# Patient Record
Sex: Male | Born: 1995 | Race: White | Hispanic: No | Marital: Single | Smoking: Never smoker
Health system: Southern US, Community
[De-identification: ages and names within clinical notes are randomized; demographics above are authoritative.]

## PROBLEM LIST (undated history)

## (undated) HISTORY — PX: TONSILLECTOMY AND ADENOIDECTOMY: SUR1326

## (undated) HISTORY — PX: TYMPANOPLASTY: SHX33

---
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
## 2013-04-21 IMAGING — CR DG CHEST 2V
1 series · 2 of 2 positions shown · non-contrast
Comparison: none

REASON FOR EXAM: shortness of breath
COMMENTS:

PROCEDURE:     DXR - DXR CHEST PA (OR AP) AND LATERAL  - December 26, 2011  [DATE]
RESULT:     The lungs are clear. The cardiac silhouette and visualized bony
skeleton are unremarkable.

[Series 1: w chest pa · 0.14mm/px · 2 of 2 slices shown]
[im 1/2]
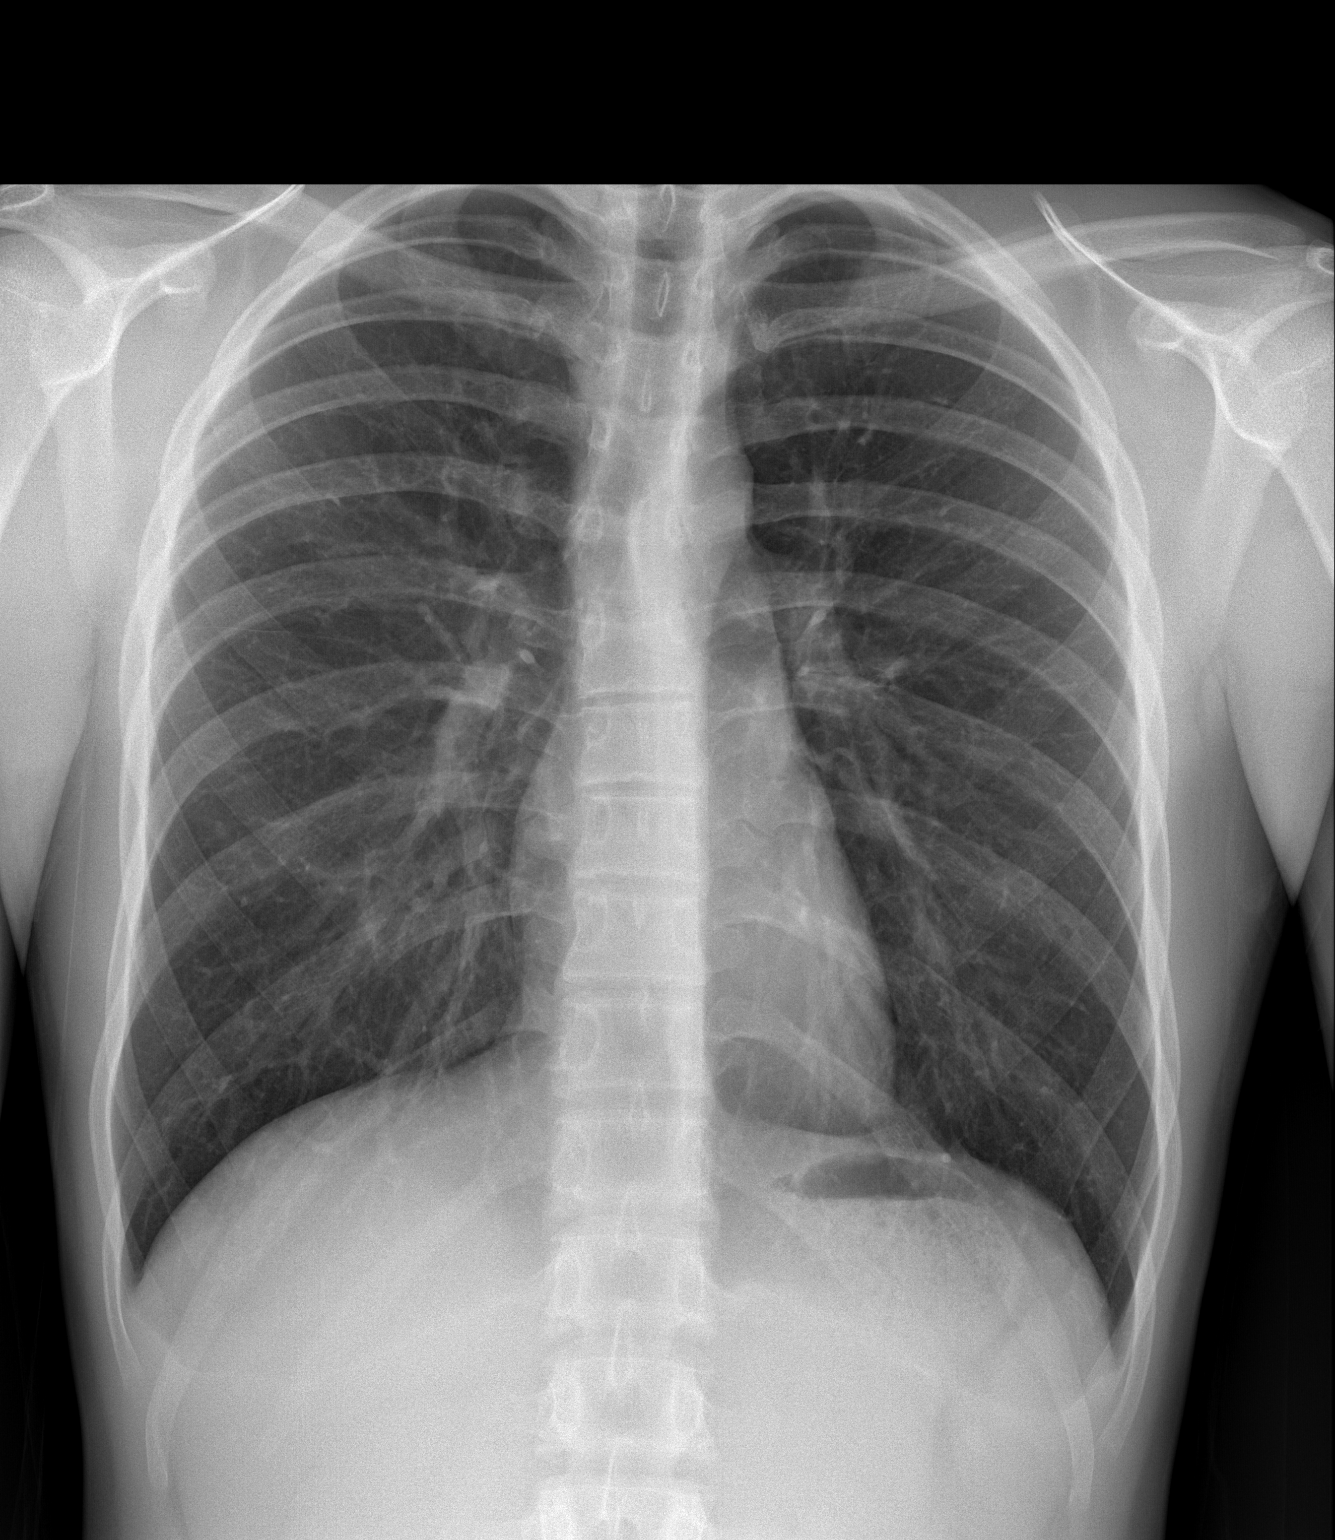
[im 2/2]
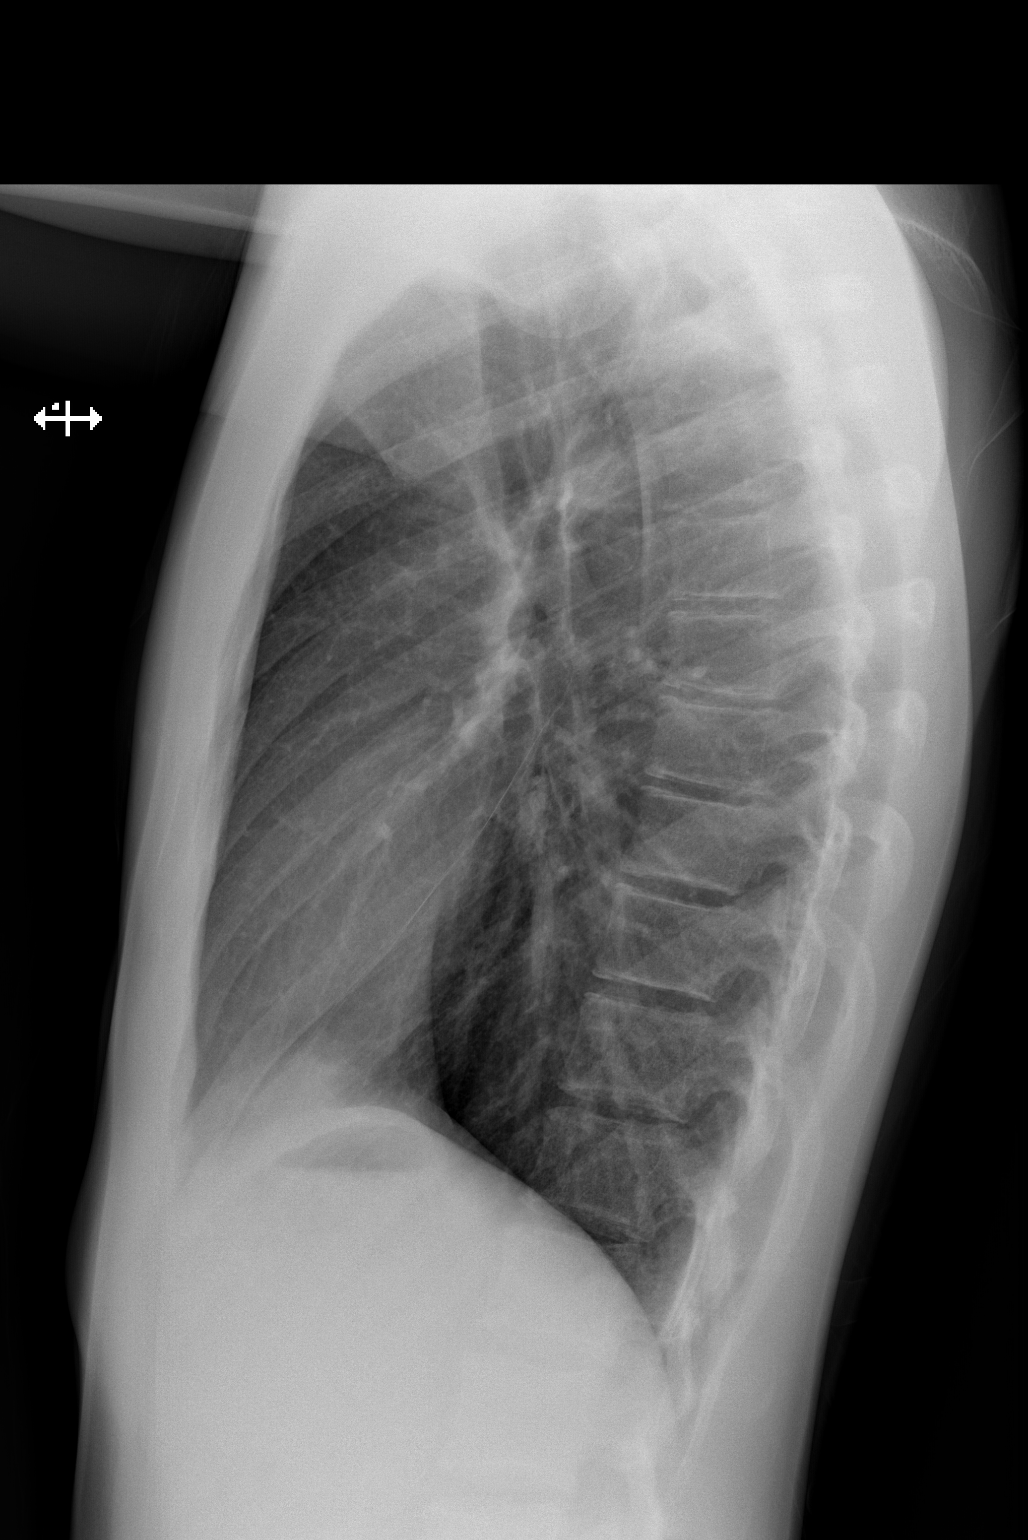

[2 of 2 positions shown; findings below may reference images not displayed]

IMPRESSION: 1. Chest radiograph without evidence of acute cardiopulmonary disease.

## 2015-08-11 DIAGNOSIS — H903 Sensorineural hearing loss, bilateral: Secondary | ICD-10-CM | POA: Diagnosis not present

## 2015-08-11 DIAGNOSIS — H93293 Other abnormal auditory perceptions, bilateral: Secondary | ICD-10-CM | POA: Diagnosis not present

## 2015-08-11 DIAGNOSIS — H9319 Tinnitus, unspecified ear: Secondary | ICD-10-CM | POA: Diagnosis not present

## 2016-04-04 DIAGNOSIS — L237 Allergic contact dermatitis due to plants, except food: Secondary | ICD-10-CM | POA: Diagnosis not present

## 2016-07-20 ENCOUNTER — Telehealth: Payer: Self-pay | Admitting: Nurse Practitioner

## 2016-07-20 DIAGNOSIS — J111 Influenza due to unidentified influenza virus with other respiratory manifestations: Secondary | ICD-10-CM

## 2016-07-20 MED ORDER — OSELTAMIVIR PHOSPHATE 75 MG PO CAPS: 75.0000 mg | ORAL_CAPSULE | Freq: Two times a day (BID) | ORAL | 0 refills | Status: AC

## 2016-07-20 NOTE — Progress Notes (Signed)

## 2017-08-28 DIAGNOSIS — L700 Acne vulgaris: Secondary | ICD-10-CM | POA: Diagnosis not present

## 2019-02-27 ENCOUNTER — Other Ambulatory Visit: Payer: Self-pay | Admitting: *Deleted

## 2019-02-27 DIAGNOSIS — Z20822 Contact with and (suspected) exposure to covid-19: Secondary | ICD-10-CM

## 2019-02-28 LAB — NOVEL CORONAVIRUS, NAA: SARS-CoV-2, NAA: NOT DETECTED

## 2019-04-15 ENCOUNTER — Other Ambulatory Visit: Payer: Self-pay | Admitting: *Deleted

## 2019-04-15 DIAGNOSIS — Z20822 Contact with and (suspected) exposure to covid-19: Secondary | ICD-10-CM

## 2019-04-17 LAB — NOVEL CORONAVIRUS, NAA: SARS-CoV-2, NAA: NOT DETECTED

## 2019-06-26 ENCOUNTER — Ambulatory Visit: Payer: BC Managed Care – PPO | Attending: Internal Medicine

## 2019-06-26 DIAGNOSIS — Z20822 Contact with and (suspected) exposure to covid-19: Secondary | ICD-10-CM

## 2019-06-27 LAB — NOVEL CORONAVIRUS, NAA: SARS-CoV-2, NAA: NOT DETECTED

## 2023-01-31 ENCOUNTER — Other Ambulatory Visit: Payer: Self-pay

## 2023-01-31 ENCOUNTER — Encounter: Payer: Self-pay | Admitting: Emergency Medicine

## 2023-01-31 ENCOUNTER — Emergency Department
Admission: EM | Admit: 2023-01-31 | Discharge: 2023-01-31 | Disposition: A | Discharge status: Home / Self Care | Payer: BC Managed Care – PPO | Attending: Emergency Medicine | Admitting: Emergency Medicine

## 2023-01-31 ENCOUNTER — Emergency Department: Payer: BC Managed Care – PPO

## 2023-01-31 DIAGNOSIS — R519 Headache, unspecified: Secondary | ICD-10-CM | POA: Insufficient documentation

## 2023-01-31 LAB — BASIC METABOLIC PANEL
Anion gap: 7 (ref 5–15)
BUN: 15 mg/dL (ref 6–20)
CO2: 27 mmol/L (ref 22–32)
Calcium: 8.8 mg/dL — ABNORMAL LOW (ref 8.9–10.3)
Chloride: 102 mmol/L (ref 98–111)
Creatinine, Ser: 0.8 mg/dL (ref 0.61–1.24)
GFR, Estimated: >60 mL/min (ref >60)
Glucose, Bld: 84 mg/dL (ref 70–99)
Potassium: 3.4 mmol/L — ABNORMAL LOW (ref 3.5–5.1)
Sodium: 136 mmol/L (ref 135–145)

## 2023-01-31 LAB — CBC
HCT: 44.5 % (ref 39.0–52.0)
Hemoglobin: 14.3 g/dL (ref 13.0–17.0)
MCH: 27.4 pg (ref 26.0–34.0)
MCHC: 32.1 g/dL (ref 30.0–36.0)
MCV: 85.2 fL (ref 80.0–100.0)
Platelets: 236 10*3/uL (ref 150–400)
RBC: 5.22 MIL/uL (ref 4.22–5.81)
RDW: 11.9 % (ref 11.5–15.5)
WBC: 5.6 10*3/uL (ref 4.0–10.5)
nRBC: 0 % (ref 0.0–0.2)

## 2023-01-31 NOTE — ED Provider Notes (Signed)
[facility] Provider Note    Event Date/Time   First MD Initiated Contact with Patient 01/31/23 1633     (approximate)   History   Chief Complaint: Headache   HPI  Evan Ellis is a 27 y.o. male with no past medical history who comes ED complaining of intermittent headaches over the past 3 weeks.  He reports that he gets headaches frequently over time, but over the last 3 weeks they seem a little bit worse.  No specific visual disturbance or prodromal symptoms.  On detailed history there is no blurred vision or double vision, no photophobia or phonophobia.  Denies a history of migraines.  No recent trauma, no fever or chills or neck stiffness.  No paresthesias or motor weakness.  No aggravating or alleviating factors.  Not position or temporally related.  Eating and drinking okay.  Describes the headache as mild and current pain score is 1.  Patient has not taken any medication at home for pain.     Physical Exam   Triage Vital Signs: ED Triage Vitals  Encounter Vitals Group     BP 01/31/23 1409 113/75     Systolic BP Percentile --      Diastolic BP Percentile --      Pulse Rate 01/31/23 1409 84     Resp 01/31/23 1409 18     Temp 01/31/23 1409 98.7 F (37.1 C)     Temp Source 01/31/23 1409 Oral     SpO2 01/31/23 1409 99 %     Weight 01/31/23 1407 129 lb (58.5 kg)     Height 01/31/23 1407 5\' 7"  (1.702 m)     Head Circumference --      Peak Flow --      Pain Score 01/31/23 1407 2     Pain Loc --      Pain Education --      Exclude from Growth Chart --     Most recent vital signs: Vitals:   01/31/23 1409  BP: 113/75  Pulse: 84  Resp: 18  Temp: 98.7 F (37.1 C)  SpO2: 99%    General: Awake, no distress.  CV:  Good peripheral perfusion.  Resp:  Normal effort.  Abd:  No distention.  Other:  Cranial nerves II through XII intact.  No nystagmus.  No APD.  Funduscopy normal with sharp optic disks.  Normal motor function and cerebellar  function  ED Results / Procedures / Treatments   Labs (all labs ordered are listed, but only abnormal results are displayed) Labs Reviewed  BASIC METABOLIC PANEL - Abnormal; Notable for the following components:      Result Value   Potassium 3.4 (*)    Calcium 8.8 (*)    All other components within normal limits  CBC     EKG    RADIOLOGY CT head interpreted by me, appears normal.  Radiology report reviewed   PROCEDURES:  Procedures   MEDICATIONS ORDERED IN ED: Medications - No data to display   IMPRESSION / MDM / ASSESSMENT AND PLAN / ED COURSE  I reviewed the triage vital signs and the nursing notes.  DDx: Benign headache syndrome.,  Electrolyte abnormality, anemia, intracranial mass  Patient's presentation is most consistent with acute presentation with potential threat to life or bodily function.  Patient presents with intermittent mild headaches.  Symptomatology is reassuring.  Exam is normal and reassuring.  Engaged in shared decision-making with the patient, and would prefer to proceed  with neuroimaging today since his headaches do seem worse than he is used to over the past few weeks, although still describes them as mild.  No thunderclap symptoms or neurodeficits.  Will obtain CT.  Patient declines NSAIDs or nausea medicine at this time.       FINAL CLINICAL IMPRESSION(S) / ED DIAGNOSES   Final diagnoses:  Nonintractable episodic headache, unspecified headache type     Rx / DC Orders   ED Discharge Orders     None        Note:  This document was prepared using Dragon voice recognition software and may include unintentional dictation errors.   Sharman Cheek, MD 01/31/23 [phone]

## 2023-01-31 NOTE — Discharge Instructions (Addendum)
Your CT scan of the brain is normal.  You can take tylenol 650mg  and/or ibuprofen 600mg  every 6 hours as needed for headaches.

## 2023-01-31 NOTE — ED Triage Notes (Signed)
Pt via POV from home. Pt c/o anterior headache starting 1 month ago intermittently. Pt also stated he has been having light sensitivity and blurred vision since Sunday. Denies any hx of migraine. Denies NV. Denies any head injury. Pt is A&OX4 and NAD.

## 2023-01-31 NOTE — ED Notes (Signed)
First Nurse Note: Patient to ED from [facility] for mild headache, asphasia, right leg weakness and blurred vision that started 12 week ago.

## 2023-02-02 ENCOUNTER — Other Ambulatory Visit: Payer: Self-pay

## 2023-02-02 MED ORDER — ESCITALOPRAM OXALATE 5 MG PO TABS
5.0000 mg | ORAL_TABLET | Freq: Every day | ORAL | 1 refills | Status: AC
  Filled 2023-02-02 (×2): qty 90, 90d supply, fill #0

## 2023-02-23 ENCOUNTER — Emergency Department: Payer: BC Managed Care – PPO

## 2023-02-23 ENCOUNTER — Emergency Department
Admission: EM | Admit: 2023-02-23 | Discharge: 2023-02-23 | Disposition: A | Discharge status: Home / Self Care | Payer: BC Managed Care – PPO | Attending: Emergency Medicine | Admitting: Emergency Medicine

## 2023-02-23 ENCOUNTER — Other Ambulatory Visit: Payer: Self-pay

## 2023-02-23 ENCOUNTER — Encounter: Payer: Self-pay | Admitting: Emergency Medicine

## 2023-02-23 DIAGNOSIS — R1011 Right upper quadrant pain: Secondary | ICD-10-CM | POA: Insufficient documentation

## 2023-02-23 DIAGNOSIS — R109 Unspecified abdominal pain: Secondary | ICD-10-CM

## 2023-02-23 LAB — COMPREHENSIVE METABOLIC PANEL
ALT: 16 U/L (ref 0–44)
AST: 19 U/L (ref 15–41)
Albumin: 4.4 g/dL (ref 3.5–5.0)
Alkaline Phosphatase: 35 U/L — ABNORMAL LOW (ref 38–126)
Anion gap: 10 (ref 5–15)
BUN: 19 mg/dL (ref 6–20)
CO2: 27 mmol/L (ref 22–32)
Calcium: 8.9 mg/dL (ref 8.9–10.3)
Chloride: 102 mmol/L (ref 98–111)
Creatinine, Ser: 0.78 mg/dL (ref 0.61–1.24)
GFR, Estimated: >60 mL/min (ref >60)
Glucose, Bld: 97 mg/dL (ref 70–99)
Potassium: 3.5 mmol/L (ref 3.5–5.1)
Sodium: 139 mmol/L (ref 135–145)
Total Bilirubin: 0.8 mg/dL (ref 0.3–1.2)
Total Protein: 7.3 g/dL (ref 6.5–8.1)

## 2023-02-23 LAB — CBC WITH DIFFERENTIAL/PLATELET
Abs Immature Granulocytes: 0.02 10*3/uL (ref 0.00–0.07)
Basophils Absolute: 0.1 10*3/uL (ref 0.0–0.1)
Basophils Relative: 1 %
Eosinophils Absolute: 0.3 10*3/uL (ref 0.0–0.5)
Eosinophils Relative: 4 %
HCT: 41.3 % (ref 39.0–52.0)
Hemoglobin: 13.5 g/dL (ref 13.0–17.0)
Immature Granulocytes: 0 %
Lymphocytes Relative: 38 %
Lymphs Abs: 3 10*3/uL (ref 0.7–4.0)
MCH: 27.8 pg (ref 26.0–34.0)
MCHC: 32.7 g/dL (ref 30.0–36.0)
MCV: 85 fL (ref 80.0–100.0)
Monocytes Absolute: 0.9 10*3/uL (ref 0.1–1.0)
Monocytes Relative: 11 %
Neutro Abs: 3.8 10*3/uL (ref 1.7–7.7)
Neutrophils Relative %: 46 %
Platelets: 207 10*3/uL (ref 150–400)
RBC: 4.86 MIL/uL (ref 4.22–5.81)
RDW: 11.9 % (ref 11.5–15.5)
WBC: 8 10*3/uL (ref 4.0–10.5)
nRBC: 0 % (ref 0.0–0.2)

## 2023-02-23 LAB — URINALYSIS, ROUTINE W REFLEX MICROSCOPIC
Bacteria, UA: NONE SEEN
Bilirubin Urine: NEGATIVE
Glucose, UA: NEGATIVE mg/dL
Ketones, ur: NEGATIVE mg/dL
Leukocytes,Ua: NEGATIVE
Nitrite: NEGATIVE
Protein, ur: NEGATIVE mg/dL
Specific Gravity, Urine: 1.013 (ref 1.005–1.030)
Squamous Epithelial / HPF: 0 /HPF (ref 0–5)
pH: 6 (ref 5.0–8.0)

## 2023-02-23 LAB — LIPASE, BLOOD: Lipase: 29 U/L (ref 11–51)

## 2023-02-23 MED ORDER — KETOROLAC TROMETHAMINE 30 MG/ML IJ SOLN
15.0000 mg | Freq: Once | INTRAMUSCULAR | Status: AC
  Administered 2023-02-23: 15 mg via INTRAVENOUS
  Filled 2023-02-23: qty 1

## 2023-02-23 MED ORDER — SODIUM CHLORIDE 0.9 % IV BOLUS
1000.0000 mL | Freq: Once | INTRAVENOUS | Status: AC
  Administered 2023-02-23: 1000 mL via INTRAVENOUS

## 2023-02-23 MED ORDER — KETOROLAC TROMETHAMINE 10 MG PO TABS
10.0000 mg | ORAL_TABLET | Freq: Four times a day (QID) | ORAL | 0 refills | Status: AC | PRN
  Filled 2023-02-23: qty 20, 5d supply, fill #0

## 2023-02-23 MED ORDER — ONDANSETRON HCL 4 MG/2ML IJ SOLN
4.0000 mg | Freq: Once | INTRAMUSCULAR | Status: AC
  Administered 2023-02-23: 4 mg via INTRAVENOUS
  Filled 2023-02-23: qty 2

## 2023-02-23 NOTE — Discharge Instructions (Addendum)
Please take your medication as needed for discomfort.  Return to the emergency department for any significant discomfort, fever, vomiting or any other symptom personally concerning to yourself.

## 2023-02-23 NOTE — ED Provider Notes (Signed)
-----------------------------------------   9:34 AM on 02/23/2023 ----------------------------------------- Patient's results have shown a small amount of blood in the patient's urine dip, reassuring CBC reassuring chemistry.  Patient CT scan shows no acute findings.  Given the small amount of hemoglobin on the urine dip with flank pain consistent with a kidney stone improved after Toradol highly suspicious of ureterolithiasis either punctate stone caught between CT slices versus passed stone.  Discussed with the patient discharged on a trial of Toradol to be used as needed and follow-up with his doctor.  I have sent a urine culture as a precaution.   Minna Antis, MD 02/23/23 [phone]

## 2023-02-23 NOTE — ED Triage Notes (Signed)
Patient ambulatory to triage with steady gait, without difficulty or distress noted; pt reports back pain that radiates into upper abd since yesterday with no accomp symptoms; denies hx of same

## 2023-02-23 NOTE — ED Provider Notes (Signed)
[facility] Provider Note    Event Date/Time   First MD Initiated Contact with Patient 02/23/23 [phone]     (approximate)   History   Abdominal Pain   HPI  Evan Ellis is a 27 y.o. male who presents to the ED from home with a chief complaint of nontraumatic right flank pain radiating to right upper abdomen which awakened him from sleep approximately 24 hours ago.  Denies associated fever/chills, chest pain, shortness of breath, nausea, vomiting or hematuria.  Denies testicular pain or swelling.     Past Medical History  History reviewed. No pertinent past medical history.   Active Problem List  There are no problems to display for this patient.    Past Surgical History   Past Surgical History:  Procedure Laterality Date   TONSILLECTOMY AND ADENOIDECTOMY     TYMPANOPLASTY       Home Medications   Prior to Admission medications   Medication Sig Start Date End Date Taking? Authorizing Provider  escitalopram (LEXAPRO) 5 MG tablet Take 1 tablet (5 mg total) by mouth daily. 02/02/23     oseltamivir (TAMIFLU) 75 MG capsule Take 1 capsule (75 mg total) by mouth 2 (two) times daily. 07/20/16   Bennie Pierini, FNP     Allergies  Augmentin [amoxicillin-pot clavulanate] and Omnicef [cefdinir]   Family History  History reviewed. No pertinent family history.   Physical Exam  Triage Vital Signs: ED Triage Vitals  Encounter Vitals Group     BP 02/23/23 0602 108/75     Systolic BP Percentile --      Diastolic BP Percentile --      Pulse Rate 02/23/23 0602 72     Resp 02/23/23 0602 16     Temp 02/23/23 0602 97.6 F (36.4 C)     Temp Source 02/23/23 0602 Oral     SpO2 02/23/23 0602 99 %     Weight 02/23/23 0556 130 lb (59 kg)     Height 02/23/23 0556 5\' 8"  (1.727 m)     Head Circumference --      Peak Flow --      Pain Score 02/23/23 0556 9     Pain Loc --      Pain Education --      Exclude from Growth Chart --     Updated  Vital Signs: BP 108/75 (BP Location: Right Arm)   Pulse 72   Temp 97.6 F (36.4 C) (Oral)   Resp 16   Ht 5\' 8"  (1.727 m)   Wt 59 kg   SpO2 99%   BMI 19.77 kg/m    General: Awake, no distress.  CV:  RRR.  Good peripheral perfusion. Resp:  Normal effort.  CTAB. Abd:  Mild right upper quadrant tenderness to palpation without rebound or guarding.  Mild right CVAT.  No distention.  Other:  No truncal vesicles.   ED Results / Procedures / Treatments  Labs (all labs ordered are listed, but only abnormal results are displayed) Labs Reviewed  COMPREHENSIVE METABOLIC PANEL - Abnormal; Notable for the following components:      Result Value   Alkaline Phosphatase 35 (*)    All other components within normal limits  CBC WITH DIFFERENTIAL/PLATELET  LIPASE, BLOOD  URINALYSIS, ROUTINE W REFLEX MICROSCOPIC     EKG  None   RADIOLOGY CT pending   Official radiology report(s): No results found.   PROCEDURES:  Critical Care performed: No  Procedures   MEDICATIONS  ORDERED IN ED: Medications  sodium chloride 0.9 % bolus 1,000 mL (1,000 mLs Intravenous New Bag/Given 02/23/23 0642)  ondansetron (ZOFRAN) injection 4 mg (4 mg Intravenous Given 02/23/23 0642)  ketorolac (TORADOL) 30 MG/ML injection 15 mg (15 mg Intravenous Given 02/23/23 6301)     IMPRESSION / MDM / ASSESSMENT AND PLAN / ED COURSE  I reviewed the triage vital signs and the nursing notes.                             27 year old male presenting with right flank/upper abdominal pain. Differential diagnosis includes, but is not limited to, biliary disease (biliary colic, acute cholecystitis, cholangitis, choledocholithiasis, etc), intrathoracic causes for epigastric abdominal pain including ACS, gastritis, duodenitis, pancreatitis, small bowel or large bowel obstruction, renal colic, abdominal aortic aneurysm, hernia, and ulcer(s).  I have personally reviewed patient's records and note a PCP office visit on 02/02/2023  for anxiety.  Patient's presentation is most consistent with acute presentation with potential threat to life or bodily function.  The patient is on the cardiac monitor to evaluate for evidence of arrhythmia and/or significant heart rate changes.  Will obtain basic lab work, UA, CT renal colic study.  Initiate IV fluid hydration, IV Toradol for pain.  Will reassess.  Clinical Course as of 02/23/23 6010  Thu Feb 23, 2023  0702 Laboratory results unremarkable.  Care transferred to Dr. Lenard Lance change of shift pending UA and CT scan. [JS]    Clinical Course User Index [JS] Irean Hong, MD     FINAL CLINICAL IMPRESSION(S) / ED DIAGNOSES   Final diagnoses:  Right upper quadrant abdominal pain  Right flank pain     Rx / DC Orders   ED Discharge Orders     None        Note:  This document was prepared using Dragon voice recognition software and may include unintentional dictation errors.   Irean Hong, MD 02/23/23 [phone]

## 2023-02-24 LAB — URINE CULTURE: Culture: NO GROWTH

## 2023-07-03 ENCOUNTER — Other Ambulatory Visit: Payer: Self-pay

## 2023-07-03 MED ORDER — ESCITALOPRAM OXALATE 5 MG PO TABS
5.0000 mg | ORAL_TABLET | Freq: Every day | ORAL | 1 refills | Status: DC
  Filled 2023-07-03: qty 30, 30d supply, fill #0
